# Patient Record
Sex: Female | Born: 1990 | Race: White | Hispanic: No | Marital: Married | Smoking: Current every day smoker
Health system: Southern US, Community
[De-identification: ages and names within clinical notes are randomized; demographics above are authoritative.]

## PROBLEM LIST (undated history)

## (undated) DIAGNOSIS — O139 Gestational [pregnancy-induced] hypertension without significant proteinuria, unspecified trimester: Secondary | ICD-10-CM

---
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
## 2018-05-28 DIAGNOSIS — K5900 Constipation, unspecified: Secondary | ICD-10-CM

## 2018-05-28 DIAGNOSIS — O99619 Diseases of the digestive system complicating pregnancy, unspecified trimester: Secondary | ICD-10-CM

## 2018-05-28 DIAGNOSIS — G43909 Migraine, unspecified, not intractable, without status migrainosus: Secondary | ICD-10-CM

## 2018-05-28 HISTORY — DX: Constipation, unspecified: K59.00

## 2018-05-28 HISTORY — DX: Diseases of the digestive system complicating pregnancy, unspecified trimester: O99.619

## 2018-05-28 HISTORY — DX: Migraine, unspecified, not intractable, without status migrainosus: G43.909

## 2019-01-27 DIAGNOSIS — O1490 Unspecified pre-eclampsia, unspecified trimester: Secondary | ICD-10-CM

## 2019-01-27 HISTORY — DX: Unspecified pre-eclampsia, unspecified trimester: O14.90

## 2019-02-26 DIAGNOSIS — F53 Postpartum depression: Secondary | ICD-10-CM

## 2019-02-26 DIAGNOSIS — O99345 Other mental disorders complicating the puerperium: Secondary | ICD-10-CM

## 2019-02-26 HISTORY — DX: Other mental disorders complicating the puerperium: O99.345

## 2019-02-26 HISTORY — DX: Postpartum depression: F53.0

## 2020-05-17 ENCOUNTER — Inpatient Hospital Stay (HOSPITAL_COMMUNITY)
Admission: AD | Admit: 2020-05-17 | Discharge: 2020-05-17 | Disposition: A | Discharge status: Home / Self Care | Payer: Managed Care, Other (non HMO) | Attending: Obstetrics and Gynecology | Admitting: Obstetrics and Gynecology

## 2020-05-17 ENCOUNTER — Encounter (HOSPITAL_COMMUNITY): Payer: Self-pay | Admitting: Obstetrics and Gynecology

## 2020-05-17 ENCOUNTER — Other Ambulatory Visit: Payer: Self-pay

## 2020-05-17 ENCOUNTER — Inpatient Hospital Stay (HOSPITAL_COMMUNITY): Payer: Managed Care, Other (non HMO)

## 2020-05-17 DIAGNOSIS — O99330 Smoking (tobacco) complicating pregnancy, unspecified trimester: Secondary | ICD-10-CM | POA: Insufficient documentation

## 2020-05-17 DIAGNOSIS — O468X9 Other antepartum hemorrhage, unspecified trimester: Secondary | ICD-10-CM | POA: Diagnosis not present

## 2020-05-17 DIAGNOSIS — Z3A00 Weeks of gestation of pregnancy not specified: Secondary | ICD-10-CM | POA: Diagnosis not present

## 2020-05-17 DIAGNOSIS — Z3490 Encounter for supervision of normal pregnancy, unspecified, unspecified trimester: Secondary | ICD-10-CM

## 2020-05-17 DIAGNOSIS — N939 Abnormal uterine and vaginal bleeding, unspecified: Secondary | ICD-10-CM

## 2020-05-17 DIAGNOSIS — O418X10 Other specified disorders of amniotic fluid and membranes, first trimester, not applicable or unspecified: Secondary | ICD-10-CM

## 2020-05-17 DIAGNOSIS — F17210 Nicotine dependence, cigarettes, uncomplicated: Secondary | ICD-10-CM | POA: Insufficient documentation

## 2020-05-17 DIAGNOSIS — O208 Other hemorrhage in early pregnancy: Secondary | ICD-10-CM | POA: Diagnosis not present

## 2020-05-17 DIAGNOSIS — O468X1 Other antepartum hemorrhage, first trimester: Secondary | ICD-10-CM

## 2020-05-17 HISTORY — DX: Gestational (pregnancy-induced) hypertension without significant proteinuria, unspecified trimester: O13.9

## 2020-05-17 LAB — URINALYSIS, ROUTINE W REFLEX MICROSCOPIC
Bilirubin Urine: NEGATIVE
Glucose, UA: NEGATIVE mg/dL
Hgb urine dipstick: NEGATIVE
Ketones, ur: NEGATIVE mg/dL
Leukocytes,Ua: NEGATIVE
Nitrite: NEGATIVE
Protein, ur: NEGATIVE mg/dL
Specific Gravity, Urine: 1.008 (ref 1.005–1.030)
pH: 6 (ref 5.0–8.0)

## 2020-05-17 LAB — ABO/RH: ABO/RH(D): O POS

## 2020-05-17 LAB — CBC
HCT: 38.5 % (ref 36.0–46.0)
Hemoglobin: 13.9 g/dL (ref 12.0–15.0)
MCH: 32.9 pg (ref 26.0–34.0)
MCHC: 36.1 g/dL — ABNORMAL HIGH (ref 30.0–36.0)
MCV: 91 fL (ref 80.0–100.0)
Platelets: 303 10*3/uL (ref 150–400)
RBC: 4.23 MIL/uL (ref 3.87–5.11)
RDW: 12.2 % (ref 11.5–15.5)
WBC: 6 10*3/uL (ref 4.0–10.5)
nRBC: 0 % (ref 0.0–0.2)

## 2020-05-17 LAB — WET PREP, GENITAL
Clue Cells Wet Prep HPF POC: NONE SEEN
Sperm: NONE SEEN
Trich, Wet Prep: NONE SEEN
Yeast Wet Prep HPF POC: NONE SEEN

## 2020-05-17 LAB — HCG, QUANTITATIVE, PREGNANCY: hCG, Beta Chain, Quant, S: 44566 m[IU]/mL — ABNORMAL HIGH (ref <5)

## 2020-05-17 LAB — POCT PREGNANCY, URINE: Preg Test, Ur: POSITIVE — AB

## 2020-05-17 NOTE — Discharge Instructions (Signed)

## 2020-05-17 NOTE — MAU Note (Signed)
.   Mikayla Boyer is a 30 y.o. at [redacted]w[redacted]d here in MAU reporting: she is here visiting family from [place] and states she has 1st appointment scheduled in January. Started having vaginal bleeding this morning with mild lower abdominal cramping LMP: unsure 03/2020  Onset of complaint: today Pain score: 2 Vitals:   05/17/20 0947  BP: 125/75  Pulse: 91  Resp: 16  Temp: 98 F (36.7 C)  SpO2: 100%     FHT: Lab orders placed from triage: UA/UPT

## 2020-05-17 NOTE — MAU Provider Note (Signed)
History     CSN: [id number]  Arrival date and time: 05/17/20 0930   Event Date/Time   First Provider Initiated Contact with Patient 05/17/20 1130      Chief Complaint  Patient presents with  . Vaginal Bleeding  . Possible Pregnancy   HPI Mikayla Boyer is a 29 y.o. [id number] in early pregnancy who presents to MAU with chief complaint of vaginal bleeding. Patient is visiting from [place], states she was relaxing with family on the couch and felt a "gush" of blood. This occurred during a single episode that lasted several minutes, then resolved without intervention. Patient states her underwear and pants were saturated. She has not visualized any bleeding since that episode.  Patient also c/o abdominal cramping, new onset two weeks ago and unchanged since that time. She reports to CNM that she has a low pain threshold and experienced similar abdominal pain during her previous pregnancy.   She denies ongoing vaginal bleeding, dysuria, fever or recent illness. She is remote from sexual intercourse.  OB History    Gravida  3   Para  1   Term      Preterm  1   AB  1   Living  1     SAB  1   IAB      Ectopic      Multiple      Live Births  1           Past Medical History:  Diagnosis Date  . Constipation during pregnancy 2020  . HPV (human papilloma virus) infection 2006  . Migraines 2020  . Postpartum depression 02/2019  . Preeclampsia   . Preeclampsia   . Preeclampsia 01/2019  . Pregnancy induced hypertension     History reviewed. No pertinent surgical history.  History reviewed. No pertinent family history.  Social History   Tobacco Use  . Smoking status: Current Every Day Smoker    Types: Cigarettes  . Smokeless tobacco: Never Used  . Tobacco comment: Pt ref. cessation counseling or materials. 1-2 cigarettes per day per pt questionairre  Substance Use Topics  . Alcohol use: Not Currently  . Drug use: Never    Allergies: Not on  File  Medications Prior to Admission  Medication Sig Dispense Refill Last Dose  . Prenatal Vit-Fe Fumarate-FA (PRENATAL MULTIVITAMIN) TABS tablet Take 1 tablet by mouth daily at 12 noon.   05/16/2020 at Unknown time    Review of Systems  Gastrointestinal: Positive for abdominal pain.  Genitourinary: Positive for vaginal bleeding.  All other systems reviewed and are negative.  Physical Exam   Blood pressure 109/69, pulse 90, temperature 98.5 F (36.9 C), temperature source Oral, resp. rate 18, height 5\' 4"  (1.626 m), weight 62.6 kg, last menstrual period 04/13/2020, SpO2 98 %.  Physical Exam Vitals and nursing note reviewed. Exam conducted with a chaperone present.  Constitutional:      Appearance: Normal appearance.  Cardiovascular:     Rate and Rhythm: Normal rate.     Pulses: Normal pulses.     Heart sounds: Normal heart sounds.  Pulmonary:     Effort: Pulmonary effort is normal.     Breath sounds: Normal breath sounds.  Abdominal:     General: Abdomen is flat. Bowel sounds are normal. There is no distension.     Tenderness: There is no abdominal tenderness. There is no right CVA tenderness or left CVA tenderness.  Skin:    Capillary Refill: Capillary refill takes less than 2  seconds.  Neurological:     Mental Status: She is alert and oriented to person, place, and time.  Psychiatric:        Mood and Affect: Mood normal.        Behavior: Behavior normal.        Thought Content: Thought content normal.        Judgment: Judgment normal.     MAU Course  Procedures  Orders Placed This Encounter  Procedures  . Wet prep, genital  . US OB LESS THAN 14 WEEKS WITH OB TRANSVAGINAL  . Urinalysis, Routine w reflex microscopic  . CBC  . hCG, quantitative, pregnancy  . Pregnancy, urine POC  . ABO/Rh   Patient Vitals for the past 24 hrs:  BP Temp Temp src Pulse Resp SpO2 Height Weight  05/17/20 1151 109/69 98.5 F (36.9 C) Oral 90 18 98 % -- --  05/17/20 1015 112/67 --  -- 91 -- -- -- --  05/17/20 0947 125/75 98 F (36.7 C) -- 91 16 100 % 5\' 4"  (1.626 m) 62.6 kg   Results for orders placed or performed during the hospital encounter of 05/17/20 (from the past 24 hour(s))  Urinalysis, Routine w reflex microscopic     Status: None   Collection Time: 05/17/20  9:49 AM  Result Value Ref Range   Color, Urine YELLOW YELLOW   APPearance CLEAR CLEAR   Specific Gravity, Urine 1.008 1.005 - 1.030   pH 6.0 5.0 - 8.0   Glucose, UA NEGATIVE NEGATIVE mg/dL   Hgb urine dipstick NEGATIVE NEGATIVE   Bilirubin Urine NEGATIVE NEGATIVE   Ketones, ur NEGATIVE NEGATIVE mg/dL   Protein, ur NEGATIVE NEGATIVE mg/dL   Nitrite NEGATIVE NEGATIVE   Leukocytes,Ua NEGATIVE NEGATIVE  CBC     Status: Abnormal   Collection Time: 05/17/20 10:07 AM  Result Value Ref Range   WBC 6.0 4.0 - 10.5 K/uL   RBC 4.23 3.87 - 5.11 MIL/uL   Hemoglobin 13.9 12.0 - 15.0 g/dL   HCT 05/19/20 67.6 - 19.5 %   MCV 91.0 80.0 - 100.0 fL   MCH 32.9 26.0 - 34.0 pg   MCHC 36.1 (H) 30.0 - 36.0 g/dL   RDW 09.3 26.7 - 12.4 %   Platelets 303 150 - 400 K/uL   nRBC 0.0 0.0 - 0.2 %  ABO/Rh     Status: None   Collection Time: 05/17/20 10:07 AM  Result Value Ref Range   ABO/RH(D) O POS    No rh immune globuloin      NOT A RH IMMUNE GLOBULIN CANDIDATE, PT RH POSITIVE Performed at [facility], [address] [place], [place]   Pregnancy, urine POC     Status: Abnormal   Collection Time: 05/17/20 10:14 AM  Result Value Ref Range   Preg Test, Ur POSITIVE (A) NEGATIVE  Wet prep, genital     Status: Abnormal   Collection Time: 05/17/20 10:33 AM  Result Value Ref Range   Yeast Wet Prep HPF POC NONE SEEN NONE SEEN   Trich, Wet Prep NONE SEEN NONE SEEN   Clue Cells Wet Prep HPF POC NONE SEEN NONE SEEN   WBC, Wet Prep HPF POC MANY (A) NONE SEEN   Sperm NONE SEEN    05/19/20 OB LESS THAN 14 WEEKS WITH OB TRANSVAGINAL  Result Date: 05/17/2020 CLINICAL DATA:  Vaginal bleeding EXAM: OBSTETRIC <14 WK  05/19/2020 AND TRANSVAGINAL OB US TECHNIQUE: Both transabdominal and transvaginal ultrasound examinations were performed  for complete evaluation of the gestation as well as the maternal uterus, adnexal regions, and pelvic cul-de-sac. Transvaginal technique was performed to assess early pregnancy. COMPARISON:  None. FINDINGS: Intrauterine gestational sac: Single Yolk sac:  Visualized. Embryo:  Visualized. Cardiac Activity: Visualized. Heart Rate: 107 bpm CRL:  2.0 mm             [place] EDC: Too small to calculate Subchorionic hemorrhage:  Small. Maternal uterus/adnexae: Unremarkable appearance of the uterus and ovaries. No free fluid within the pelvis. IMPRESSION: 1. Single live intrauterine gestation measuring 2.0 mm by crown-rump length. Age and EDC were unable to be calculated secondary to small size/early gestational age. Attention on follow-up. 2. Active embryonic heart tones at 107 bpm. 3. Small subchorionic hemorrhage. Electronically Signed   By: Duanne Guess D.O.   On: 05/17/2020 11:17   Assessment and Plan  --29 y.o. [id number] with confirmed SIUP --Subchorionic Hematoma, pelvic rest advised --Blood type O POS --Discharge home in stable condition with first trimester precautions  F/U: --Patient has New OB scheduled at home in [place] for early January   Calvert Cantor, [place] 05/17/2020, 3:12 PM

## 2020-05-18 LAB — GC/CHLAMYDIA PROBE AMP (~~LOC~~) NOT AT ARMC
Chlamydia: NEGATIVE
Comment: NEGATIVE
Comment: NORMAL
Neisseria Gonorrhea: NEGATIVE

## 2021-08-18 IMAGING — US US OB < 14 WEEKS - US OB TV
1 series · 15 of 28 positions shown · non-contrast
Comparison: None.

CLINICAL DATA: Vaginal bleeding

EXAM:
OBSTETRIC <14 WK US AND TRANSVAGINAL OB US
TECHNIQUE: Both transabdominal and transvaginal ultrasound examinations were
performed for complete evaluation of the gestation as well as the
maternal uterus, adnexal regions, and pelvic cul-de-sac.
Transvaginal technique was performed to assess early pregnancy.

[Series 1: us ob < 14 weeks - us ob tv · 15 of 57 slices shown]
[im 1/57]
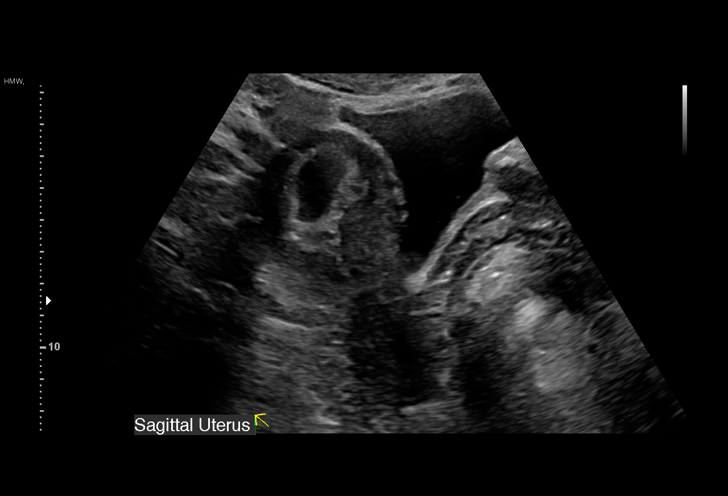
[im 5/57]
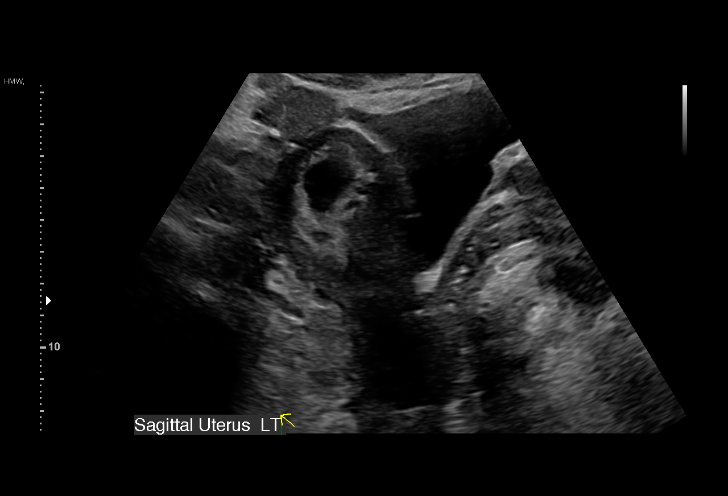
[im 9/57]
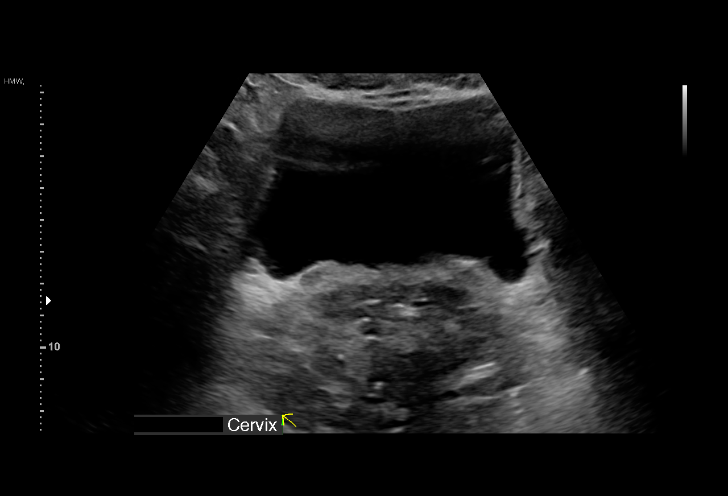
[im 13/57]
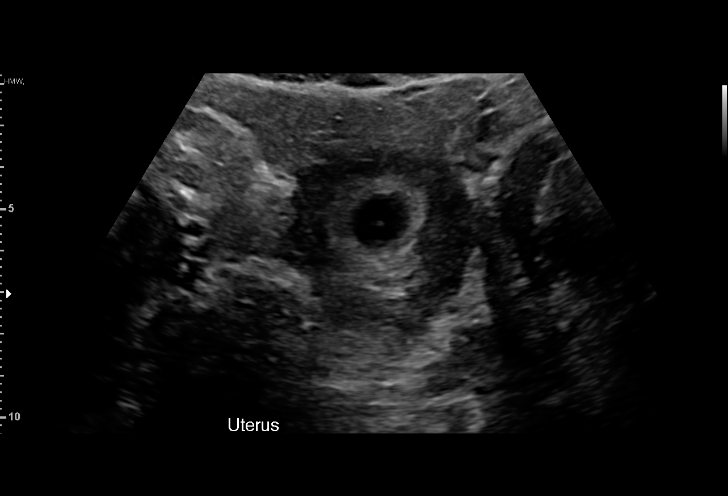
[im 17/57]
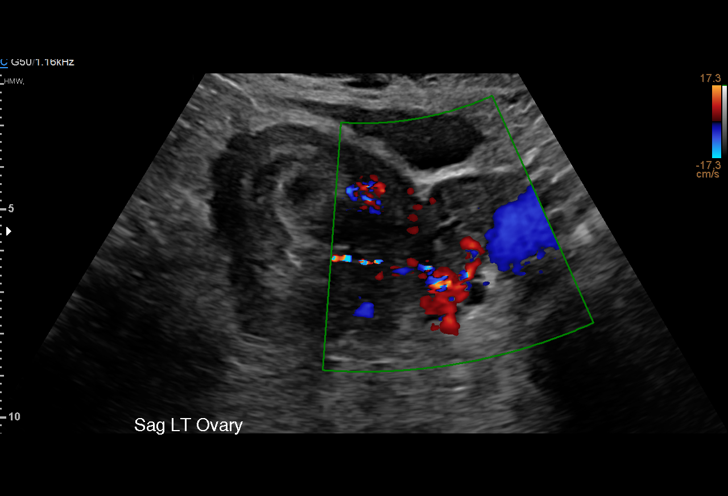
[im 21/57]
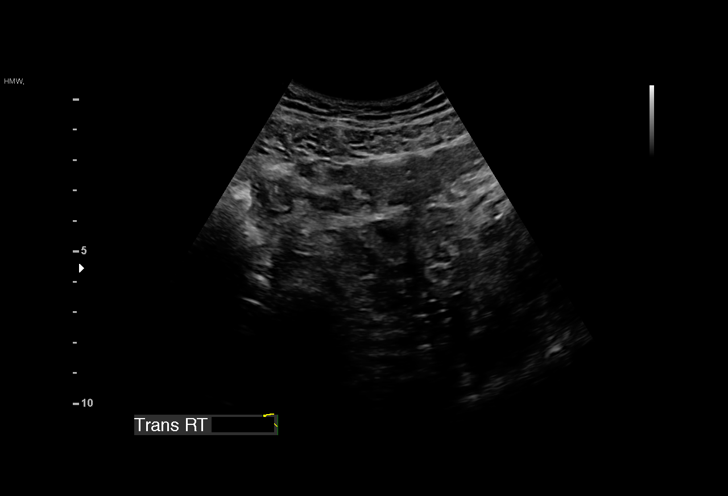
[im 25/57]
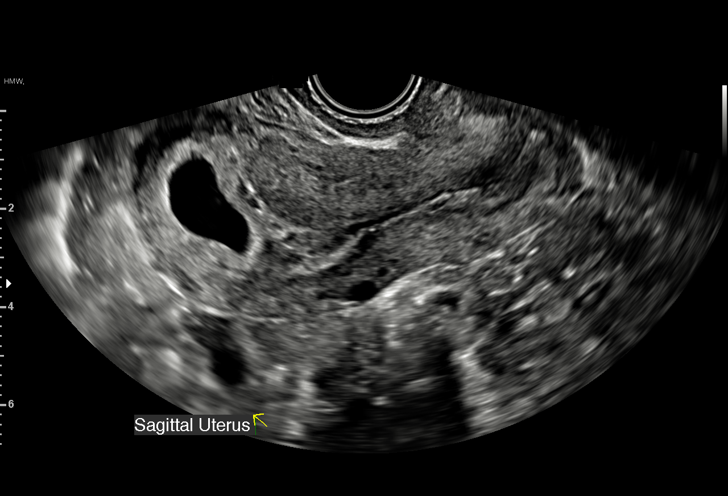
[im 30/57]
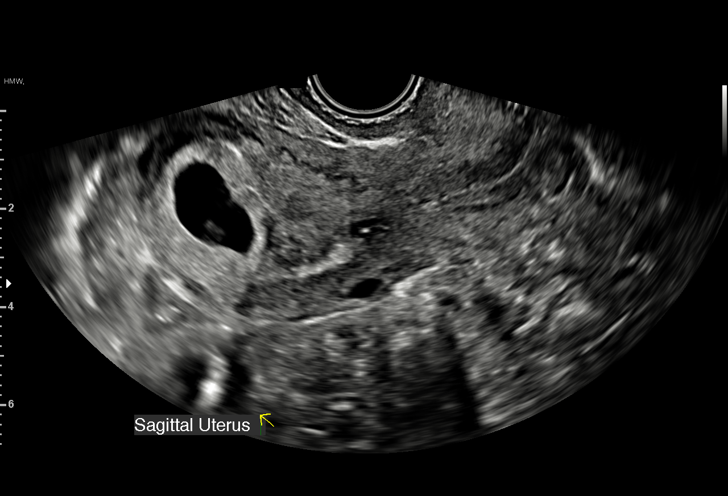
[im 32/57]
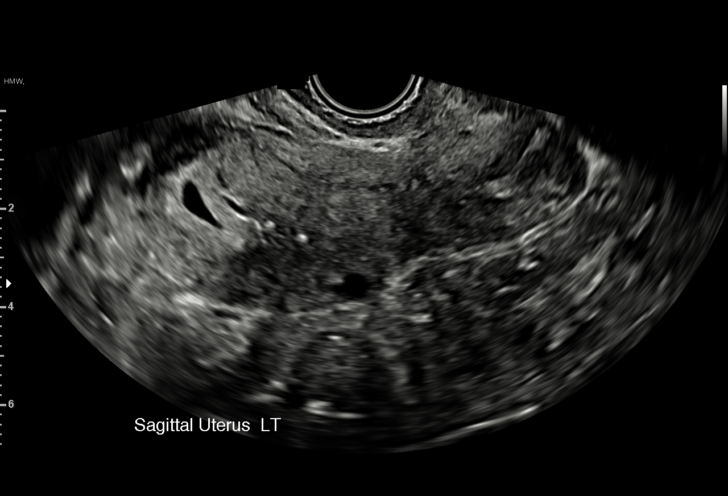
[im 36/57]
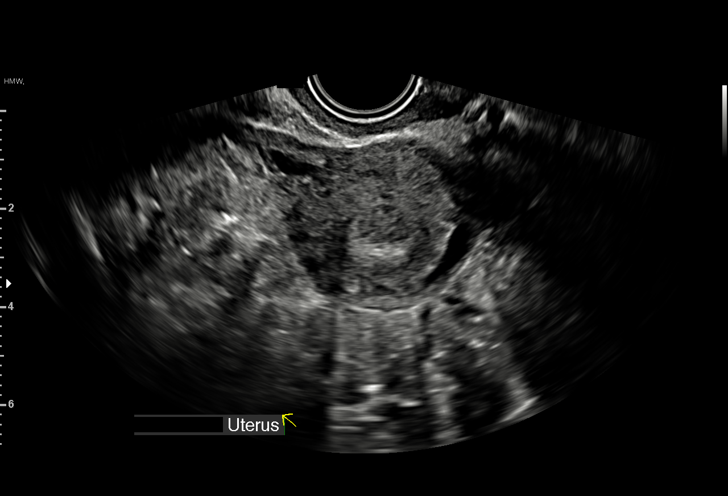
[im 40/57]
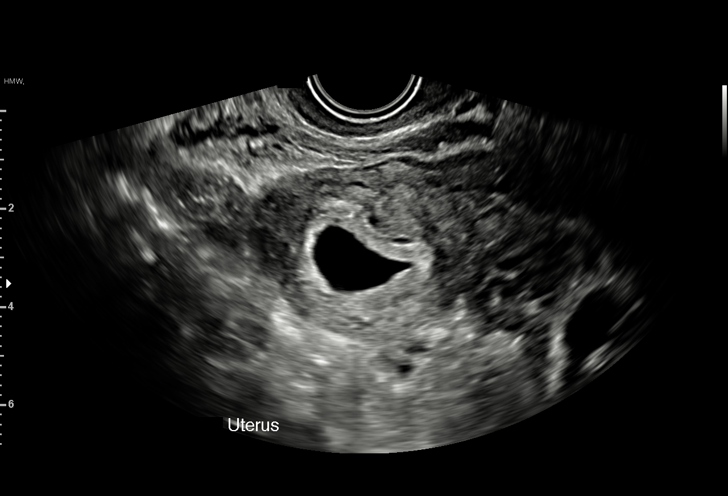
[im 44/57]
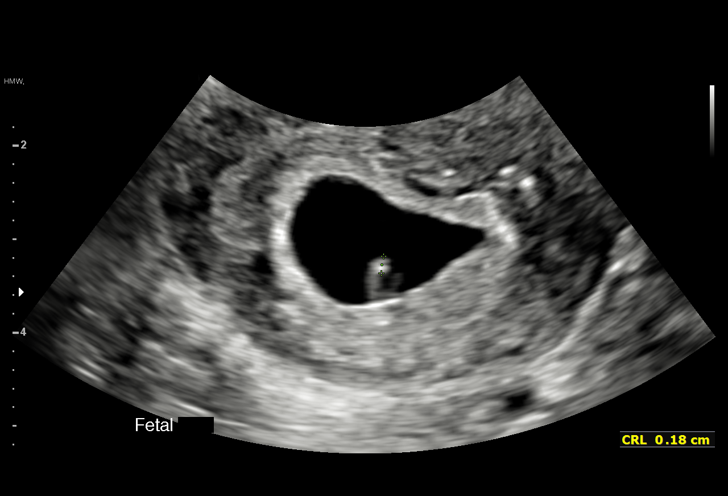
[im 48/57]
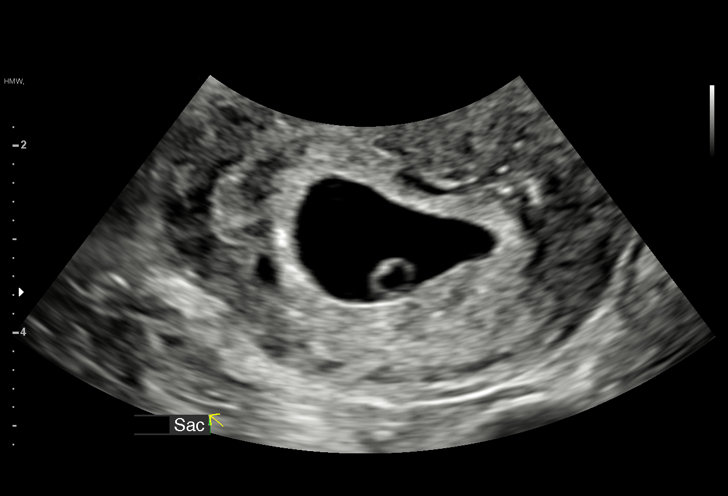
[im 52/57]
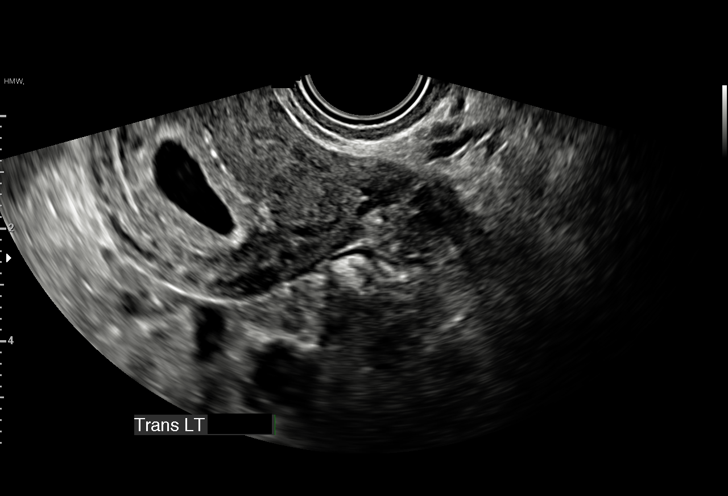
[im 57/57]
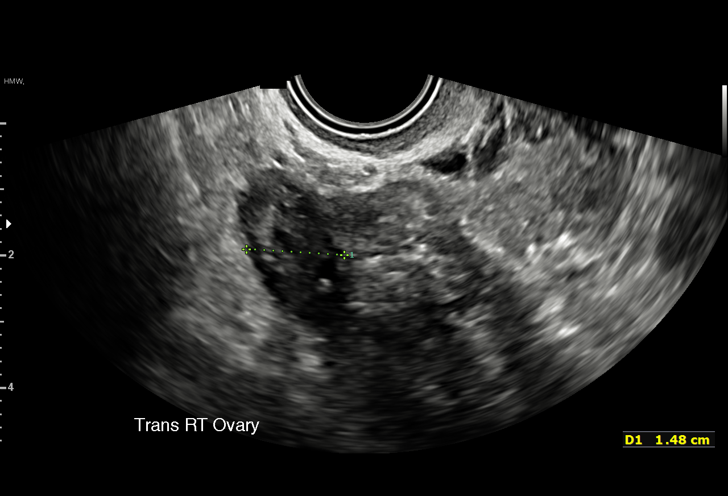

[15 of 28 positions shown; findings below may reference images not displayed]

FINDINGS: Intrauterine gestational sac: Single

Yolk sac:  Visualized.

Embryo:  Visualized.

Cardiac Activity: Visualized.

Heart Rate: 107 bpm

CRL:  2.0 mm             US EDC: Too small to calculate

Subchorionic hemorrhage:  Small.

Maternal uterus/adnexae: Unremarkable appearance of the uterus and
ovaries. No free fluid within the pelvis.
IMPRESSION: 1. Single live intrauterine gestation measuring 2.0 mm by crown-rump
length. Age and EDC were unable to be calculated secondary to small
size/early gestational age. Attention on follow-up.
2. Active embryonic heart tones at 107 bpm.
3. Small subchorionic hemorrhage.
